# Patient Record
Sex: Male | Born: 1995 | Race: Asian | Hispanic: No | Marital: Single | Smoking: Never smoker
Health system: Southern US, Community
[De-identification: ages and names within clinical notes are randomized; demographics above are authoritative.]

## PROBLEM LIST (undated history)

## (undated) DIAGNOSIS — J45909 Unspecified asthma, uncomplicated: Secondary | ICD-10-CM

## (undated) DIAGNOSIS — T7840XA Allergy, unspecified, initial encounter: Secondary | ICD-10-CM

## (undated) HISTORY — DX: Allergy, unspecified, initial encounter: T78.40XA

## (undated) HISTORY — DX: Unspecified asthma, uncomplicated: J45.909

---
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
## 2017-11-27 ENCOUNTER — Ambulatory Visit: Payer: Self-pay | Admitting: Family Medicine

## 2017-12-08 ENCOUNTER — Ambulatory Visit: Payer: Medicaid Other | Attending: Family Medicine | Admitting: Family Medicine

## 2017-12-08 ENCOUNTER — Encounter: Payer: Self-pay | Admitting: Family Medicine

## 2017-12-08 VITALS — BP 117/70 | HR 67 | Temp 98.3°F | Resp 18 | Ht 67.0 in | Wt 175.0 lb

## 2017-12-08 DIAGNOSIS — Z021 Encounter for pre-employment examination: Secondary | ICD-10-CM | POA: Diagnosis not present

## 2017-12-08 DIAGNOSIS — Z79899 Other long term (current) drug therapy: Secondary | ICD-10-CM | POA: Diagnosis not present

## 2017-12-08 DIAGNOSIS — Z7689 Persons encountering health services in other specified circumstances: Secondary | ICD-10-CM | POA: Insufficient documentation

## 2017-12-08 NOTE — Patient Instructions (Signed)
You will be called with your labs results.  

## 2017-12-08 NOTE — Progress Notes (Signed)
   Subjective:  Patient ID: Daniel Hancock, male    DOB: 22-Feb-1996  Age: 22 y.o. MRN: [id number]  CC: Establish Care   HPI Daniel Hancock presents to establish care. He is requesting UDS 12 panel. 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 He reportsgetting some labs performed on his own previously. He does report mariajuana use. Last use was 11/01/17.     Outpatient Medications Prior to Visit  Medication Sig Dispense Refill  . ondansetron (ZOFRAN-ODT) 4 MG disintegrating tablet Take 1 tablet (4 mg total) by mouth every 8 (eight) hours as needed for nausea or vomiting. 10 tablet 0   No facility-administered medications prior to visit.     ROS Review of Systems  Constitutional: Negative.   Respiratory: Negative.   Cardiovascular: Negative.   Psychiatric/Behavioral: Negative.     Objective:  BP 117/70 (BP Location: Right Arm, Patient Position: Sitting, Cuff Size: Large)   Pulse 67   Temp 98.3 F (36.8 C) (Oral)   Resp 18   Ht 5\' 7"  (1.702 m)   Wt 175 lb (79.4 kg)   SpO2 99%   BMI 27.41 kg/m   BP/Weight 12/08/2017 12/25/2013 06/15/2013  Systolic BP 117 119 110  Diastolic BP 70 58 62  Wt. (Lbs) 175 198 190  BMI 27.41 - 31.13     Physical Exam  Constitutional: He appears well-developed and well-nourished.  Cardiovascular: Normal rate, regular rhythm, normal heart sounds and intact distal pulses.  Pulmonary/Chest: Effort normal and breath sounds normal.  Skin: Skin is warm and dry.  Psychiatric: He has a normal mood and affect.  Nursing note and vitals reviewed.    Assessment & Plan:   1. Drug screening, pre-employment  - Drug Screen 12+Alcohol+CRT, Ur      Follow-up: Return As needed.   Lizbeth BarkMandesia R Kayman Snuffer FNP

## 2017-12-09 LAB — DRUG SCREEN 12+ALCOHOL+CRT, UR
Amphetamines, Urine: NEGATIVE ng/mL
BENZODIAZ UR QL: NEGATIVE ng/mL
Barbiturate: NEGATIVE ng/mL
CANNABINOIDS: NEGATIVE ng/mL
CREATININE, RANDOM U: 54.3 mg/dL (ref 20.0–300.0)
Cocaine (Metabolite): NEGATIVE ng/mL
Ethanol, Urine: NEGATIVE %
MEPERIDINE: NEGATIVE ng/mL
METHADONE: NEGATIVE ng/mL
OPIATE SCREEN URINE: NEGATIVE ng/mL
OXYCODONE+OXYMORPHONE UR QL SCN: NEGATIVE ng/mL
PHENCYCLIDINE: NEGATIVE ng/mL
PROPOXYPHENE: NEGATIVE ng/mL
TRAMADOL: NEGATIVE ng/mL

## 2017-12-11 ENCOUNTER — Telehealth: Payer: Self-pay | Admitting: Family Medicine

## 2017-12-11 NOTE — Telephone Encounter (Signed)
Pt. Called requesting his drug screening results. Please f/u

## 2017-12-11 NOTE — Telephone Encounter (Signed)
-----   Message from Lizbeth BarkMandesia R Hairston, FNP sent at 12/09/2017  3:26 PM EST ----- UDS result negative for any illicit substances including amphetamine, opiates, cannabinoids.

## 2017-12-11 NOTE — Telephone Encounter (Signed)
Patient is aware of a copy being placed at the front desk for him to pick up.

## 2018-03-24 ENCOUNTER — Ambulatory Visit: Payer: Self-pay | Attending: Family Medicine | Admitting: Physician Assistant

## 2018-03-24 VITALS — BP 116/72 | HR 91 | Temp 99.0°F | Resp 16 | Ht 67.0 in | Wt 181.2 lb

## 2018-03-24 DIAGNOSIS — Z113 Encounter for screening for infections with a predominantly sexual mode of transmission: Secondary | ICD-10-CM | POA: Insufficient documentation

## 2018-03-24 DIAGNOSIS — B999 Unspecified infectious disease: Secondary | ICD-10-CM

## 2018-03-24 LAB — POCT URINALYSIS DIPSTICK
Bilirubin, UA: NEGATIVE
Blood, UA: NEGATIVE
GLUCOSE UA: NEGATIVE
Ketones, UA: NEGATIVE
LEUKOCYTES UA: NEGATIVE
Nitrite, UA: NEGATIVE
Protein, UA: NEGATIVE
SPEC GRAV UA: 1.025 (ref 1.010–1.025)
Urobilinogen, UA: 0.2 E.U./dL
pH, UA: 5.5 (ref 5.0–8.0)

## 2018-03-24 NOTE — Progress Notes (Signed)
Patient ID: Daniel Hancock, male   DOB: 09-13-1996, 22 y.o.   MRN: [id number]   Daniel Hancock, is a 22 y.o. male  NGE:[id number]  [id number]:[id number]  DOB - Jan 29, 1996  Subjective:  Chief Complaint and HPI: Chrstopher Hancock is a 22 y.o. male here today for STI screening.  No s/sx.  Has multiple partners and only sometimes uses protection.   ROS:   Constitutional:  No f/c, No night sweats, No unexplained weight loss. EENT:  No vision changes, No blurry vision, No hearing changes. No mouth, throat, or ear problems.  Respiratory: No cough, No SOB Cardiac: No CP, no palpitations GI:  No abd pain, No N/V/D. GU: No Urinary s/sx Musculoskeletal: No joint pain Neuro: No headache, no dizziness, no motor weakness.  Skin: No rash Endocrine:  No polydipsia. No polyuria.  Psych: Denies SI/HI  No problems updated.  ALLERGIES: No Known Allergies  PAST MEDICAL HISTORY: Past Medical History:  Diagnosis Date  . Allergy   . Asthma     MEDICATIONS AT HOME: Prior to Admission medications   Not on File     Objective:  EXAM:   Vitals:   03/24/18 1558  BP: 116/72  Pulse: 91  Resp: 16  Temp: 99 F (37.2 C)  TempSrc: Oral  SpO2: 96%  Weight: 181 lb 3.2 oz (82.2 kg)  Height:  (1.702 m)    General appearance : A&OX3. NAD. Non-toxic-appearing HEENT: Atraumatic and Normocephalic.  PERRLA. EOM intact.   Neck: supple, no JVD. No cervical lymphadenopathy. No thyromegaly Chest/Lungs:  Breathing-non-labored, Good air entry bilaterally, breath sounds normal without rales, rhonchi, or wheezing  CVS: S1 S2 regular, no murmurs, gallops, rubs  Extremities: Bilateral Lower Ext shows no edema, both legs are warm to touch with = pulse throughout Neurology:  CN II-XII grossly intact, Non focal.   Psych:  TP linear. J/I WNL. Normal speech. Appropriate eye contact and affect.  Skin:  No Rash  Data Review No results found for: HGBA1C   Assessment & Plan   1. Communicable disease Condoms always and  safe sex practices discussed and encouraged at length.   - Urinalysis Dipstick - Urine cytology ancillary only - HIV antibody (with reflex) - RPR  Patient have been counseled extensively about nutrition and exercise  Return if symptoms worsen or fail to improve.  The patient was given clear instructions to go to ER or return to medical center if symptoms don't improve, worsen or new problems develop. The patient verbalized understanding. The patient was told to call to get lab results if they haven't heard anything in the next week.     Georgian Co, PA-C [facility] [place], [place] [phone]   03/24/2018, 4:21 PM

## 2018-03-24 NOTE — Patient Instructions (Signed)

## 2018-03-24 NOTE — Progress Notes (Signed)
Patient want a STD test done.

## 2018-03-25 LAB — RPR: RPR: NONREACTIVE

## 2018-03-25 LAB — HIV ANTIBODY (ROUTINE TESTING W REFLEX): HIV SCREEN 4TH GENERATION: NONREACTIVE

## 2018-03-26 ENCOUNTER — Telehealth: Payer: Self-pay | Admitting: Physician Assistant

## 2018-03-26 LAB — URINE CYTOLOGY ANCILLARY ONLY
Chlamydia: NEGATIVE
Neisseria Gonorrhea: NEGATIVE
Trichomonas: NEGATIVE

## 2018-03-26 NOTE — Telephone Encounter (Signed)
Patient called an requested update on most recent lab results.  Please follow up

## 2018-03-29 NOTE — Telephone Encounter (Signed)
-----   Message from Anders Simmonds, [place] sent at 03/29/2018 10:20 AM EDT ----- Please call patient.  His STD testing was negative for HIV, syphilis, chlamydia, gonorrhea, and trichomonas. Advise safe sex practices.  Thanks, Georgian Co, PA-C

## 2018-03-29 NOTE — Telephone Encounter (Signed)
CMA spoke to patient to inform on lab results.  Patient understood. Patient request to have his lab results print out.

## 2018-03-30 LAB — URINE CYTOLOGY ANCILLARY ONLY
Bacterial vaginitis: NEGATIVE
CANDIDA VAGINITIS: NEGATIVE
